# Patient Record
Sex: Female | Born: 1965 | Race: Black or African American | Hispanic: No | Marital: Married | Smoking: Current some day smoker
Health system: Southern US, Community
[De-identification: ages and names within clinical notes are randomized; demographics above are authoritative.]

## PROBLEM LIST (undated history)

## (undated) DIAGNOSIS — D259 Leiomyoma of uterus, unspecified: Secondary | ICD-10-CM

## (undated) DIAGNOSIS — I10 Essential (primary) hypertension: Secondary | ICD-10-CM

---

## 2021-11-22 ENCOUNTER — Emergency Department (HOSPITAL_COMMUNITY): Payer: BLUE CROSS/BLUE SHIELD

## 2021-11-22 ENCOUNTER — Encounter (HOSPITAL_COMMUNITY): Payer: Self-pay

## 2021-11-22 ENCOUNTER — Emergency Department (HOSPITAL_COMMUNITY)
Admission: EM | Admit: 2021-11-22 | Discharge: 2021-11-23 | Disposition: A | Discharge status: Home / Self Care | Payer: BLUE CROSS/BLUE SHIELD | Attending: Emergency Medicine | Admitting: Emergency Medicine

## 2021-11-22 ENCOUNTER — Other Ambulatory Visit: Payer: Self-pay

## 2021-11-22 DIAGNOSIS — Z20822 Contact with and (suspected) exposure to covid-19: Secondary | ICD-10-CM | POA: Diagnosis not present

## 2021-11-22 DIAGNOSIS — N3000 Acute cystitis without hematuria: Secondary | ICD-10-CM

## 2021-11-22 DIAGNOSIS — R509 Fever, unspecified: Secondary | ICD-10-CM | POA: Diagnosis present

## 2021-11-22 DIAGNOSIS — M791 Myalgia, unspecified site: Secondary | ICD-10-CM | POA: Insufficient documentation

## 2021-11-22 HISTORY — DX: Leiomyoma of uterus, unspecified: D25.9

## 2021-11-22 HISTORY — DX: Essential (primary) hypertension: I10

## 2021-11-22 LAB — CBC WITH DIFFERENTIAL/PLATELET
Abs Immature Granulocytes: 0.08 10*3/uL — ABNORMAL HIGH (ref 0.00–0.07)
Basophils Absolute: 0 10*3/uL (ref 0.0–0.1)
Basophils Relative: 0 %
Eosinophils Absolute: 0 10*3/uL (ref 0.0–0.5)
Eosinophils Relative: 0 %
HCT: 40.9 % (ref 36.0–46.0)
Hemoglobin: 14.1 g/dL (ref 12.0–15.0)
Immature Granulocytes: 1 %
Lymphocytes Relative: 5 %
Lymphs Abs: 0.5 10*3/uL — ABNORMAL LOW (ref 0.7–4.0)
MCH: 30.4 pg (ref 26.0–34.0)
MCHC: 34.5 g/dL (ref 30.0–36.0)
MCV: 88.1 fL (ref 80.0–100.0)
Monocytes Absolute: 1.1 10*3/uL — ABNORMAL HIGH (ref 0.1–1.0)
Monocytes Relative: 10 %
Neutro Abs: 9.2 10*3/uL — ABNORMAL HIGH (ref 1.7–7.7)
Neutrophils Relative %: 84 %
Platelets: 126 10*3/uL — ABNORMAL LOW (ref 150–400)
RBC: 4.64 MIL/uL (ref 3.87–5.11)
RDW: 14.3 % (ref 11.5–15.5)
WBC: 10.9 10*3/uL — ABNORMAL HIGH (ref 4.0–10.5)
nRBC: 0 % (ref 0.0–0.2)

## 2021-11-22 LAB — URINALYSIS, ROUTINE W REFLEX MICROSCOPIC
Bilirubin Urine: NEGATIVE
Glucose, UA: NEGATIVE mg/dL
Ketones, ur: NEGATIVE mg/dL
Nitrite: POSITIVE — AB
Protein, ur: 100 mg/dL — AB
Specific Gravity, Urine: 1.015 (ref 1.005–1.030)
WBC, UA: >50 WBC/hpf — ABNORMAL HIGH (ref 0–5)
pH: 5 (ref 5.0–8.0)

## 2021-11-22 LAB — COMPREHENSIVE METABOLIC PANEL
ALT: 40 U/L (ref 0–44)
AST: 57 U/L — ABNORMAL HIGH (ref 15–41)
Albumin: 3.4 g/dL — ABNORMAL LOW (ref 3.5–5.0)
Alkaline Phosphatase: 106 U/L (ref 38–126)
Anion gap: 14 (ref 5–15)
BUN: 17 mg/dL (ref 6–20)
CO2: 22 mmol/L (ref 22–32)
Calcium: 10 mg/dL (ref 8.9–10.3)
Chloride: 100 mmol/L (ref 98–111)
Creatinine, Ser: 1.09 mg/dL — ABNORMAL HIGH (ref 0.44–1.00)
GFR, Estimated: >60 mL/min (ref >60)
Glucose, Bld: 145 mg/dL — ABNORMAL HIGH (ref 70–99)
Potassium: 3.5 mmol/L (ref 3.5–5.1)
Sodium: 136 mmol/L (ref 135–145)
Total Bilirubin: 1 mg/dL (ref 0.3–1.2)
Total Protein: 7 g/dL (ref 6.5–8.1)

## 2021-11-22 MED ORDER — ACETAMINOPHEN 325 MG PO TABS
650.0000 mg | ORAL_TABLET | Freq: Once | ORAL | Status: AC | PRN
  Administered 2021-11-22: 650 mg via ORAL
  Filled 2021-11-22: qty 2

## 2021-11-22 NOTE — ED Notes (Signed)
Pt reports she last took tylenol over 6 hours ago ?

## 2021-11-22 NOTE — ED Triage Notes (Signed)
Pt reports body aches, sharp pains throughout her body and fever (tmax 104 today) and states she took tylenol tonight. Symptoms started two days ago. HR 142 in triage. Temp 102.9 ?

## 2021-11-22 NOTE — ED Provider Triage Note (Signed)
?  Emergency Medicine Provider Triage Evaluation Note ? ?MRN:  [id number]  ?Arrival date & time: 11/22/21    ?Medically screening exam initiated at 10:24 PM.   ?CC:   ?Fever ?  ?HPI:  ?Catherine Fitzpatrick is a 56 y.o. year-old female presents to the ED with chief complaint of fever and generalized body aches.  Denies sore throat, cough, n/v/d, dysuria.  Took Tylenol PTA. ? ?History provided by History provided by patient. ?ROS:  ?-As included in HPI ?PE:  ?There were no vitals filed for this visit.  ?Non-toxic appearing ?No respiratory distress ?No focal abdominal tenderness ?MDM:  ?Based on signs and symptoms, flu or covid is highest on my differential, followed by UTI or other infectious etiology. ?I've ordered covid swabs, CXR, and urine in triage to expedite lab/diagnostic workup. ? ?Patient was informed that the remainder of the evaluation will be completed by another provider, this initial triage assessment does not replace that evaluation, and the importance of remaining in the ED until their evaluation is complete. ? ?  ?Montine Circle, PA-C ?11/22/21 2225 ? ?

## 2021-11-23 LAB — RESP PANEL BY RT-PCR (FLU A&B, COVID) ARPGX2
Influenza A by PCR: NEGATIVE
Influenza B by PCR: NEGATIVE
SARS Coronavirus 2 by RT PCR: NEGATIVE

## 2021-11-23 MED ORDER — CEPHALEXIN 500 MG PO CAPS: 500.0000 mg | ORAL_CAPSULE | Freq: Two times a day (BID) | ORAL | 0 refills | Status: DC

## 2021-11-23 MED ORDER — CEPHALEXIN 250 MG PO CAPS
500.0000 mg | ORAL_CAPSULE | Freq: Once | ORAL | Status: AC
  Administered 2021-11-23: 500 mg via ORAL
  Filled 2021-11-23: qty 2

## 2021-11-23 NOTE — ED Provider Notes (Signed)
?[facility] DEPT ?[facility] Emergency Department ?Provider Note ?MRN:  [id number]  ?Arrival date & time: 11/23/21    ? ?Chief Complaint   ?Fever ?  ?History of Present Illness   ?Catherine Fitzpatrick is a 56 y.o. year-old female presents to the ED with chief complaint of  fever and generalized body aches.  Denies sore throat, cough, n/v/d, dysuria.  Took Tylenol PTA.   ? ?States that she noticed her urine was orange while waiting in the waiting room. ? ?History provided by patient. ? ? ?Review of Systems  ?Pertinent review of systems noted in HPI.  ? ? ?Physical Exam  ? ?Vitals:  ? 11/23/21 0047 11/23/21 0309  ?BP: 110/72 123/84  ?Pulse: (!) 103 92  ?Resp: 18 16  ?Temp: 99.6 ?F (37.6 ?C) 98.4 ?F (36.9 ?C)  ?SpO2: 99% 97%  ?  ?CONSTITUTIONAL:  well-appearing, NAD ?NEURO:  Alert and oriented x 3, CN 3-12 grossly intact ?EYES:  eyes equal and reactive ?ENT/NECK:  Supple, no stridor  ?CARDIO:  normal rate, regular rhythm, appears well-perfused  ?PULM:  No respiratory distress,  ?GI/GU:  non-distended, no focal abdominal tenderness ?MSK/SPINE:  No gross deformities, no edema, moves all extremities  ?SKIN:  no rash, atraumatic ? ? ?*Additional and/or pertinent findings included in MDM below ? ?Diagnostic and Interventional Summary  ? ? ?Labs Reviewed  ?URINALYSIS, ROUTINE W REFLEX MICROSCOPIC - Abnormal; Notable for the following components:  ?    Result Value  ? Color, Urine AMBER (*)   ? APPearance CLOUDY (*)   ? Hgb urine dipstick MODERATE (*)   ? Protein, ur 100 (*)   ? Nitrite POSITIVE (*)   ? Leukocytes,Ua LARGE (*)   ? WBC, UA >50 (*)   ? Bacteria, UA MANY (*)   ? Non Squamous Epithelial 6-10 (*)   ? All other components within normal limits  ?CBC WITH DIFFERENTIAL/PLATELET - Abnormal; Notable for the following components:  ? WBC 10.9 (*)   ? Platelets 126 (*)   ? Neutro Abs 9.2 (*)   ? Lymphs Abs 0.5 (*)   ? Monocytes Absolute 1.1 (*)   ? Abs Immature Granulocytes 0.08 (*)   ? All other components within  normal limits  ?COMPREHENSIVE METABOLIC PANEL - Abnormal; Notable for the following components:  ? Glucose, Bld 145 (*)   ? Creatinine, Ser 1.09 (*)   ? Albumin 3.4 (*)   ? AST 57 (*)   ? All other components within normal limits  ?RESP PANEL BY RT-PCR (FLU A&B, COVID) [id number]  ?  ?DG Chest 2 View  ?Final Result  ?  ?  ?Medications  ?cephALEXin (KEFLEX) capsule 500 mg (has no administration in time range)  ?acetaminophen (TYLENOL) tablet 650 mg (650 mg Oral Given 11/22/21 2245)  ?  ? ?Procedures  /  Critical Care ?Procedures ? ?ED Course and Medical Decision Making  ?I have reviewed the triage vital signs, the nursing notes, and pertinent available records from the EMR. ? ?Complexity of Problems Addressed: ?Moderate Complexity: Acute illness with systemic symptoms, requiring diagnostic workup to rule out more severe disease. ?Comorbidities affecting this illness/injury include: ?None ?Social Determinants Affecting Care: ?No clinically significant social determinants affecting this chief complaint.. ? ? ?ED Course: ?After considering the following differential, covid, flu, URI, UTI, I ordered labs, CXR, and UA. ?I personally interpreted the labs which are notable for abnormal UA ?I visualized the chest x-ray which is notable for no pneumonia and agree with the radiologist interpretation.. ? ?  ? ?  Consultants: ?No consultations were needed in caring for this patient. ? ?Treatment and Plan: ?UTI - Treat with keflex 500 mg BID ?Return precautions discussed ? ?Emergency department workup does not suggest an emergent condition requiring admission or immediate intervention beyond  what has been performed at this time. The patient is safe for discharge and has  been instructed to return immediately for worsening symptoms, change in  symptoms or any other concerns ? ? ? ?Final Clinical Impressions(s) / ED Diagnoses  ? ?  ICD-10-CM   ?1. Acute cystitis without hematuria  N30.00   ?  ?  ?ED Discharge Orders   ? ?      Ordered  ?   cephALEXin (KEFLEX) 500 MG capsule  2 times daily       ? 11/23/21 0335  ? ?  ?  ? ?  ?  ? ? ?Discharge Instructions Discussed with and Provided to Patient:  ? ?Discharge Instructions   ?None ?  ? ?  ?Montine Circle, PA-C ?11/23/21 0335 ? ?  ?Maudie Flakes, MD ?11/23/21 [phone] ? ?

## 2021-11-26 ENCOUNTER — Telehealth: Payer: BLUE CROSS/BLUE SHIELD | Admitting: Physician Assistant

## 2021-11-26 DIAGNOSIS — N3001 Acute cystitis with hematuria: Secondary | ICD-10-CM

## 2021-11-26 MED ORDER — SULFAMETHOXAZOLE-TRIMETHOPRIM 800-160 MG PO TABS: 1.0000 | ORAL_TABLET | Freq: Two times a day (BID) | ORAL | 0 refills | Status: DC

## 2021-11-26 NOTE — Patient Instructions (Signed)
?Tinamarie Derstine, thank you for joining Mar Daring, PA-C for today's virtual visit.  While this provider is not your primary care provider (PCP), if your PCP is located in our provider database this encounter information will be shared with them immediately following your visit. ? ?Consent: ?(Patient) Catherine Fitzpatrick provided verbal consent for this virtual visit at the beginning of the encounter. ? ?Current Medications: ? ?Current Outpatient Medications:  ?  sulfamethoxazole-trimethoprim (BACTRIM DS) 800-160 MG tablet, Take 1 tablet by mouth 2 (two) times daily., Disp: 10 tablet, Rfl: 0 ?  cephALEXin (KEFLEX) 500 MG capsule, Take 1 capsule (500 mg total) by mouth 2 (two) times daily., Disp: 14 capsule, Rfl: 0  ? ?Medications ordered in this encounter:  ?Meds ordered this encounter  ?Medications  ? sulfamethoxazole-trimethoprim (BACTRIM DS) 800-160 MG tablet  ?  Sig: Take 1 tablet by mouth 2 (two) times daily.  ?  Dispense:  10 tablet  ?  Refill:  0  ?  Order Specific Question:   Supervising Provider  ?  Answer:   Noemi Chapel [[id number]]  ?  ? ?*If you need refills on other medications prior to your next appointment, please contact your pharmacy* ? ?Follow-Up: ?Call back or seek an in-person evaluation if the symptoms worsen or if the condition fails to improve as anticipated. ? ?Other Instructions ?Urinary Tract Infection, Adult ?A urinary tract infection (UTI) is an infection of any part of the urinary tract. The urinary tract includes the kidneys, ureters, bladder, and urethra. These organs make, store, and get rid of urine in the body. ?An upper UTI affects the ureters and kidneys. A lower UTI affects the bladder and urethra. ?What are the causes? ?Most urinary tract infections are caused by bacteria in your genital area around your urethra, where urine leaves your body. These bacteria grow and cause inflammation of your urinary tract. ?What increases the risk? ?You are more likely to develop this condition  if: ?You have a urinary catheter that stays in place. ?You are not able to control when you urinate or have a bowel movement (incontinence). ?You are female and you: ?Use a spermicide or diaphragm for birth control. ?Have low estrogen levels. ?Are pregnant. ?You have certain genes that increase your risk. ?You are sexually active. ?You take antibiotic medicines. ?You have a condition that causes your flow of urine to slow down, such as: ?An enlarged prostate, if you are female. ?Blockage in your urethra. ?A kidney stone. ?A nerve condition that affects your bladder control (neurogenic bladder). ?Not getting enough to drink, or not urinating often. ?You have certain medical conditions, such as: ?Diabetes. ?A weak disease-fighting system (immunesystem). ?Sickle cell disease. ?Gout. ?Spinal cord injury. ?What are the signs or symptoms? ?Symptoms of this condition include: ?Needing to urinate right away (urgency). ?Frequent urination. This may include small amounts of urine each time you urinate. ?Pain or burning with urination. ?Blood in the urine. ?Urine that smells bad or unusual. ?Trouble urinating. ?Cloudy urine. ?Vaginal discharge, if you are female. ?Pain in the abdomen or the lower back. ?You may also have: ?Vomiting or a decreased appetite. ?Confusion. ?Irritability or tiredness. ?A fever or chills. ?Diarrhea. ?The first symptom in older adults may be confusion. In some cases, they may not have any symptoms until the infection has worsened. ?How is this diagnosed? ?This condition is diagnosed based on your medical history and a physical exam. You may also have other tests, including: ?Urine tests. ?Blood tests. ?Tests for STIs (sexually transmitted infections). ?  If you have had more than one UTI, a cystoscopy or imaging studies may be done to determine the cause of the infections. ?How is this treated? ?Treatment for this condition includes: ?Antibiotic medicine. ?Over-the-counter medicines to treat  discomfort. ?Drinking enough water to stay hydrated. ?If you have frequent infections or have other conditions such as a kidney stone, you may need to see a health care provider who specializes in the urinary tract (urologist). ?In rare cases, urinary tract infections can cause sepsis. Sepsis is a life-threatening condition that occurs when the body responds to an infection. Sepsis is treated in the hospital with IV antibiotics, fluids, and other medicines. ?Follow these instructions at home: ?Medicines ?Take over-the-counter and prescription medicines only as told by your health care provider. ?If you were prescribed an antibiotic medicine, take it as told by your health care provider. Do not stop using the antibiotic even if you start to feel better. ?General instructions ?Make sure you: ?Empty your bladder often and completely. Do not hold urine for long periods of time. ?Empty your bladder after sex. ?Wipe from front to back after urinating or having a bowel movement if you are female. Use each tissue only one time when you wipe. ?Drink enough fluid to keep your urine pale yellow. ?Keep all follow-up visits. This is important. ?Contact a health care provider if: ?Your symptoms do not get better after 1-2 days. ?Your symptoms go away and then return. ?Get help right away if: ?You have severe pain in your back or your lower abdomen. ?You have a fever or chills. ?You have nausea or vomiting. ?Summary ?A urinary tract infection (UTI) is an infection of any part of the urinary tract, which includes the kidneys, ureters, bladder, and urethra. ?Most urinary tract infections are caused by bacteria in your genital area. ?Treatment for this condition often includes antibiotic medicines. ?If you were prescribed an antibiotic medicine, take it as told by your health care provider. Do not stop using the antibiotic even if you start to feel better. ?Keep all follow-up visits. This is important. ?This information is not  intended to replace advice given to you by your health care provider. Make sure you discuss any questions you have with your health care provider. ?Document Revised: 04/06/2020 Document Reviewed: 04/06/2020 ?Elsevier Patient Education ? 2022 [place]. ? ? ? ?If you have been instructed to have an in-person evaluation today at a local Urgent Care facility, please use the link below. It will take you to a list of all of our available [facility], including address, phone number and hours of operation. Please do not delay care.  ?[facility] ? ?If you or a family member do not have a primary care provider, use the link below to schedule a visit and establish care. When you choose a [facility] primary care physician or advanced practice provider, you gain a long-term partner in health. ?Find a Primary Care Provider ? ?Learn more about [facility]'s in-office and virtual care options: ?[facility] ?

## 2021-11-26 NOTE — Progress Notes (Signed)
?Virtual Visit Consent  ? ?Catherine Fitzpatrick, you are scheduled for a virtual visit with a [facility] provider today.   ?  ?Just as with appointments in the office, your consent must be obtained to participate.  Your consent will be active for this visit and any virtual visit you may have with one of our providers in the next 365 days.   ?  ?If you have a MyChart account, a copy of this consent can be sent to you electronically.  All virtual visits are billed to your insurance company just like a traditional visit in the office.   ? ?As this is a virtual visit, video technology does not allow for your provider to perform a traditional examination.  This may limit your provider's ability to fully assess your condition.  If your provider identifies any concerns that need to be evaluated in person or the need to arrange testing (such as labs, EKG, etc.), we will make arrangements to do so.   ?  ?Although advances in technology are sophisticated, we cannot ensure that it will always work on either your end or our end.  If the connection with a video visit is poor, the visit may have to be switched to a telephone visit.  With either a video or telephone visit, we are not always able to ensure that we have a secure connection.    ? ?I need to obtain your verbal consent now.   Are you willing to proceed with your visit today?  ?  ?Delayla Ging has provided verbal consent on 11/26/2021 for a virtual visit (video or telephone). ?  ?Mar Daring, PA-C  ? ?Date: 11/26/2021 6:56 PM ? ? ?Virtual Visit via Video Note  ? Catherine Fitzpatrick, connected with  Catherine Fitzpatrick  ([id number], Apr 22, 1966) on 11/26/21 at  7:15 PM EDT by a video-enabled telemedicine application and verified that I am speaking with the correct person using two identifiers. ? ?Location: ?Patient: Virtual Visit Location Patient: Home ?Provider: Virtual Visit Location Provider: Home Office ?  ?I discussed the limitations of evaluation and management by  telemedicine and the availability of in person appointments. The patient expressed understanding and agreed to proceed.   ? ?History of Present Illness: ?Catherine Fitzpatrick is a 56 y.o. who identifies as a female who was assigned female at birth, and is being seen today for continued UTI symptoms. Was seen at the ER on 11/22/21 and found to have a UTI. Was started on Keflex '500mg'$  BID. Is still having fevers, cloudy urine, suprapubic pressure, and burning with urination. Denies chills, nausea, vomiting, flank pain. ? ? ? ?Problems: There are no problems to display for this patient. ?  ?Allergies:  ?Allergies  ?Allergen Reactions  ? Codeine Itching  ? ?Medications:  ?Current Outpatient Medications:  ?  sulfamethoxazole-trimethoprim (BACTRIM DS) 800-160 MG tablet, Take 1 tablet by mouth 2 (two) times daily., Disp: 10 tablet, Rfl: 0 ?  cephALEXin (KEFLEX) 500 MG capsule, Take 1 capsule (500 mg total) by mouth 2 (two) times daily., Disp: 14 capsule, Rfl: 0 ? ?Observations/Objective: ?Patient is well-developed, well-nourished in no acute distress.  ?Resting comfortably at home.  ?Head is normocephalic, atraumatic.  ?No labored breathing.  ?Speech is clear and coherent with logical content.  ?Patient is alert and oriented at baseline.  ? ? ?Assessment and Plan: ?1. Acute cystitis with hematuria ?- sulfamethoxazole-trimethoprim (BACTRIM DS) 800-160 MG tablet; Take 1 tablet by mouth 2 (two) times daily.  Dispense: 10 tablet; Refill:  0 ? ?- Suspect possible bacterial resistance, no urine culture ordered ?- Stop Keflex ?- Start Bactrim ?- Push fluids ?- Tylenol as needed ?- Seek in person evaluation if symptoms continue to worsen or not improve ? ?Follow Up Instructions: ?I discussed the assessment and treatment plan with the patient. The patient was provided an opportunity to ask questions and all were answered. The patient agreed with the plan and demonstrated an understanding of the instructions.  A copy of instructions were  sent to the patient via MyChart unless otherwise noted below.  ? ? ?The patient was advised to call back or seek an in-person evaluation if the symptoms worsen or if the condition fails to improve as anticipated. ? ?Time:  ?I spent 12 minutes with the patient via telehealth technology discussing the above problems/concerns.   ? ?Mar Daring, PA-C ?

## 2022-05-03 ENCOUNTER — Other Ambulatory Visit: Payer: Self-pay | Admitting: Pharmacist

## 2022-05-03 NOTE — Progress Notes (Addendum)
Community Screening Event  Patient seen at community screening event. Reports medication adherence to amlodipine 5 mg and HCTZ 50 mg daily, which is confirmed with fill history.   Dietary and lifestyle counseling provided by Dr. Joya Gaskins.   Patient also does report dehydration - likely related to HCTZ at 50 mg daily. Patient encouraged to check blood pressure at home and contact provider to schedule follow up. Dr Joya Gaskins increasing amlodipine 10 mg today. Consider reducing HCTZ moving forward given complaints of dehydration.  Patient interested in a provider here in [place]. Information given regarding [facility].   Catie Hedwig Morton, PharmD, [facility] [phone]

## 2022-05-21 ENCOUNTER — Ambulatory Visit (HOSPITAL_BASED_OUTPATIENT_CLINIC_OR_DEPARTMENT_OTHER): Payer: BLUE CROSS/BLUE SHIELD | Admitting: Critical Care Medicine

## 2022-05-21 DIAGNOSIS — Z91199 Patient's noncompliance with other medical treatment and regimen due to unspecified reason: Secondary | ICD-10-CM

## 2022-05-22 NOTE — Progress Notes (Signed)
Patient ID: Catherine Fitzpatrick, female   DOB: 1966-03-18, 56 y.o.   MRN: [id number] Pt was no show

## 2023-03-04 IMAGING — DX DG CHEST 2V
2 series · 2 of 2 positions shown · non-contrast
Comparison: None.

CLINICAL DATA: Cough, congestion and fever.

EXAM:
CHEST - 2 VIEW

[chest pa]
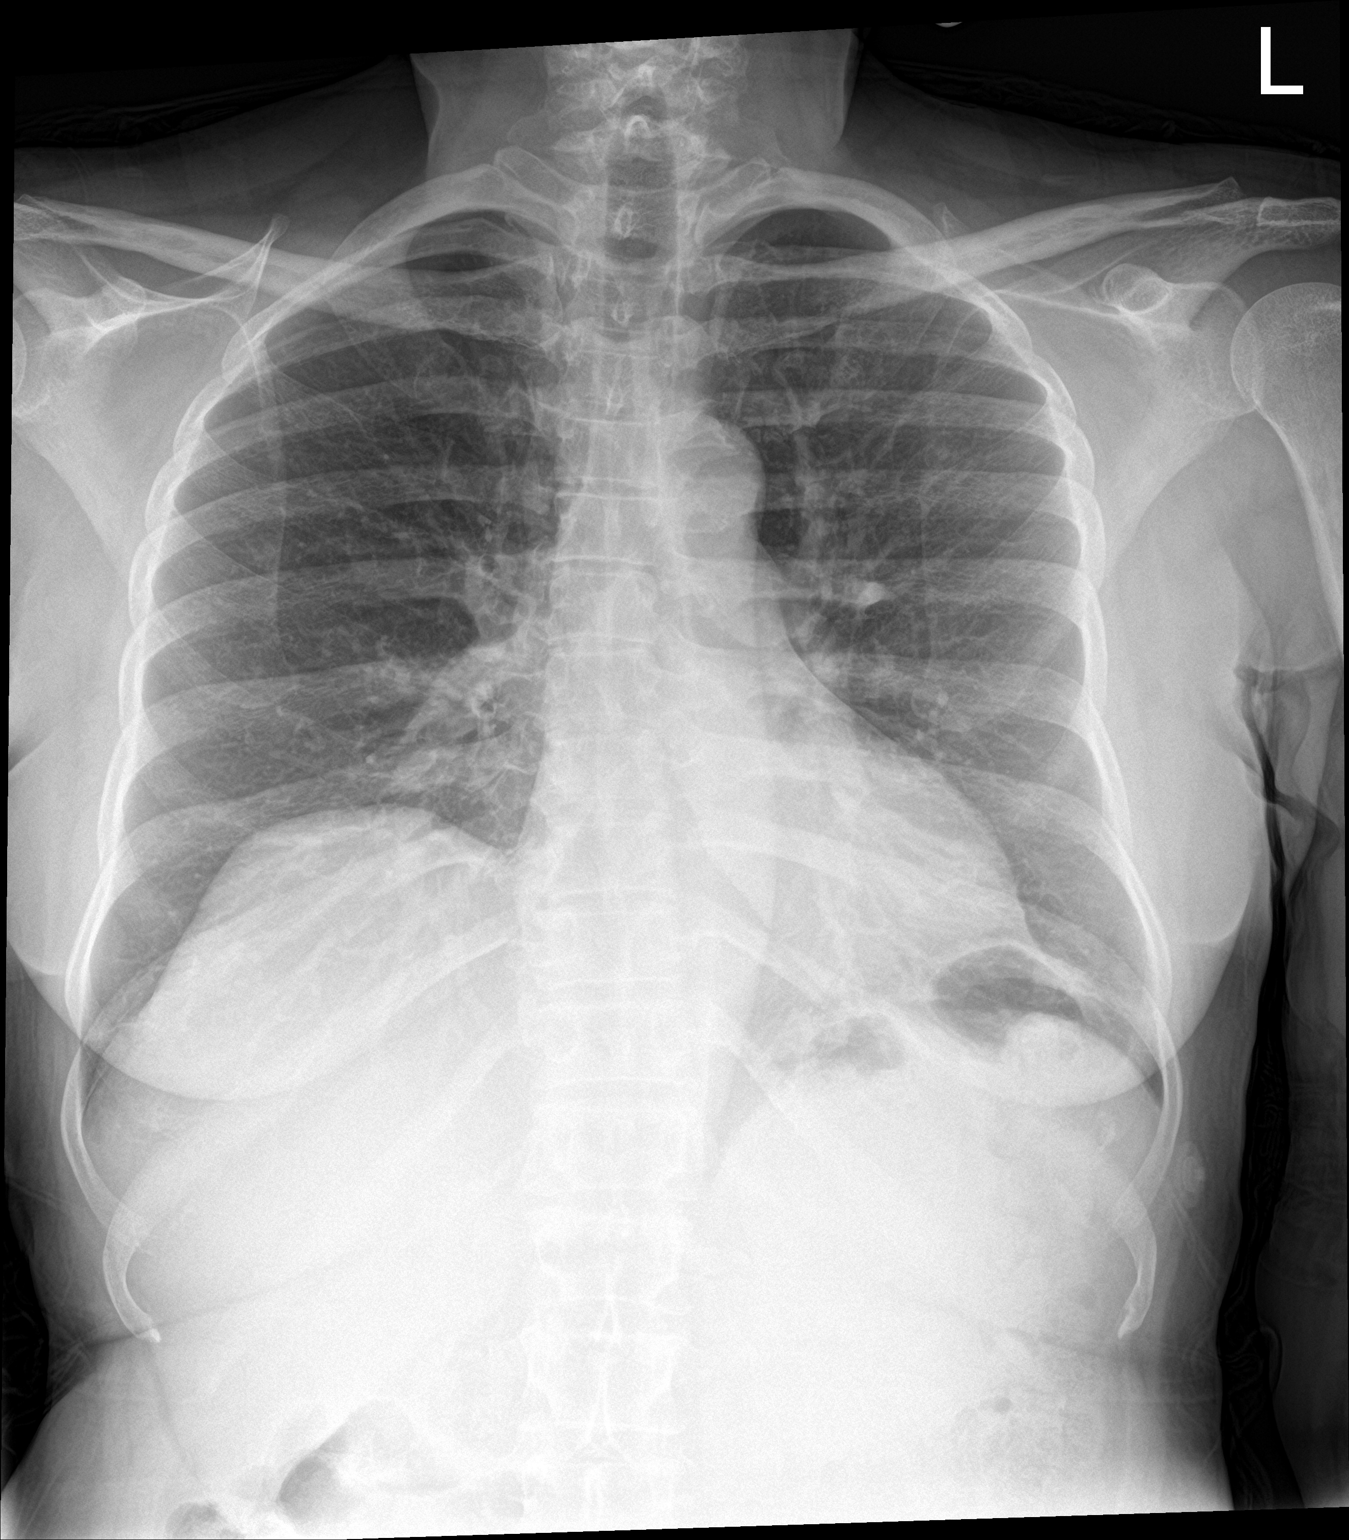

[chest lat]
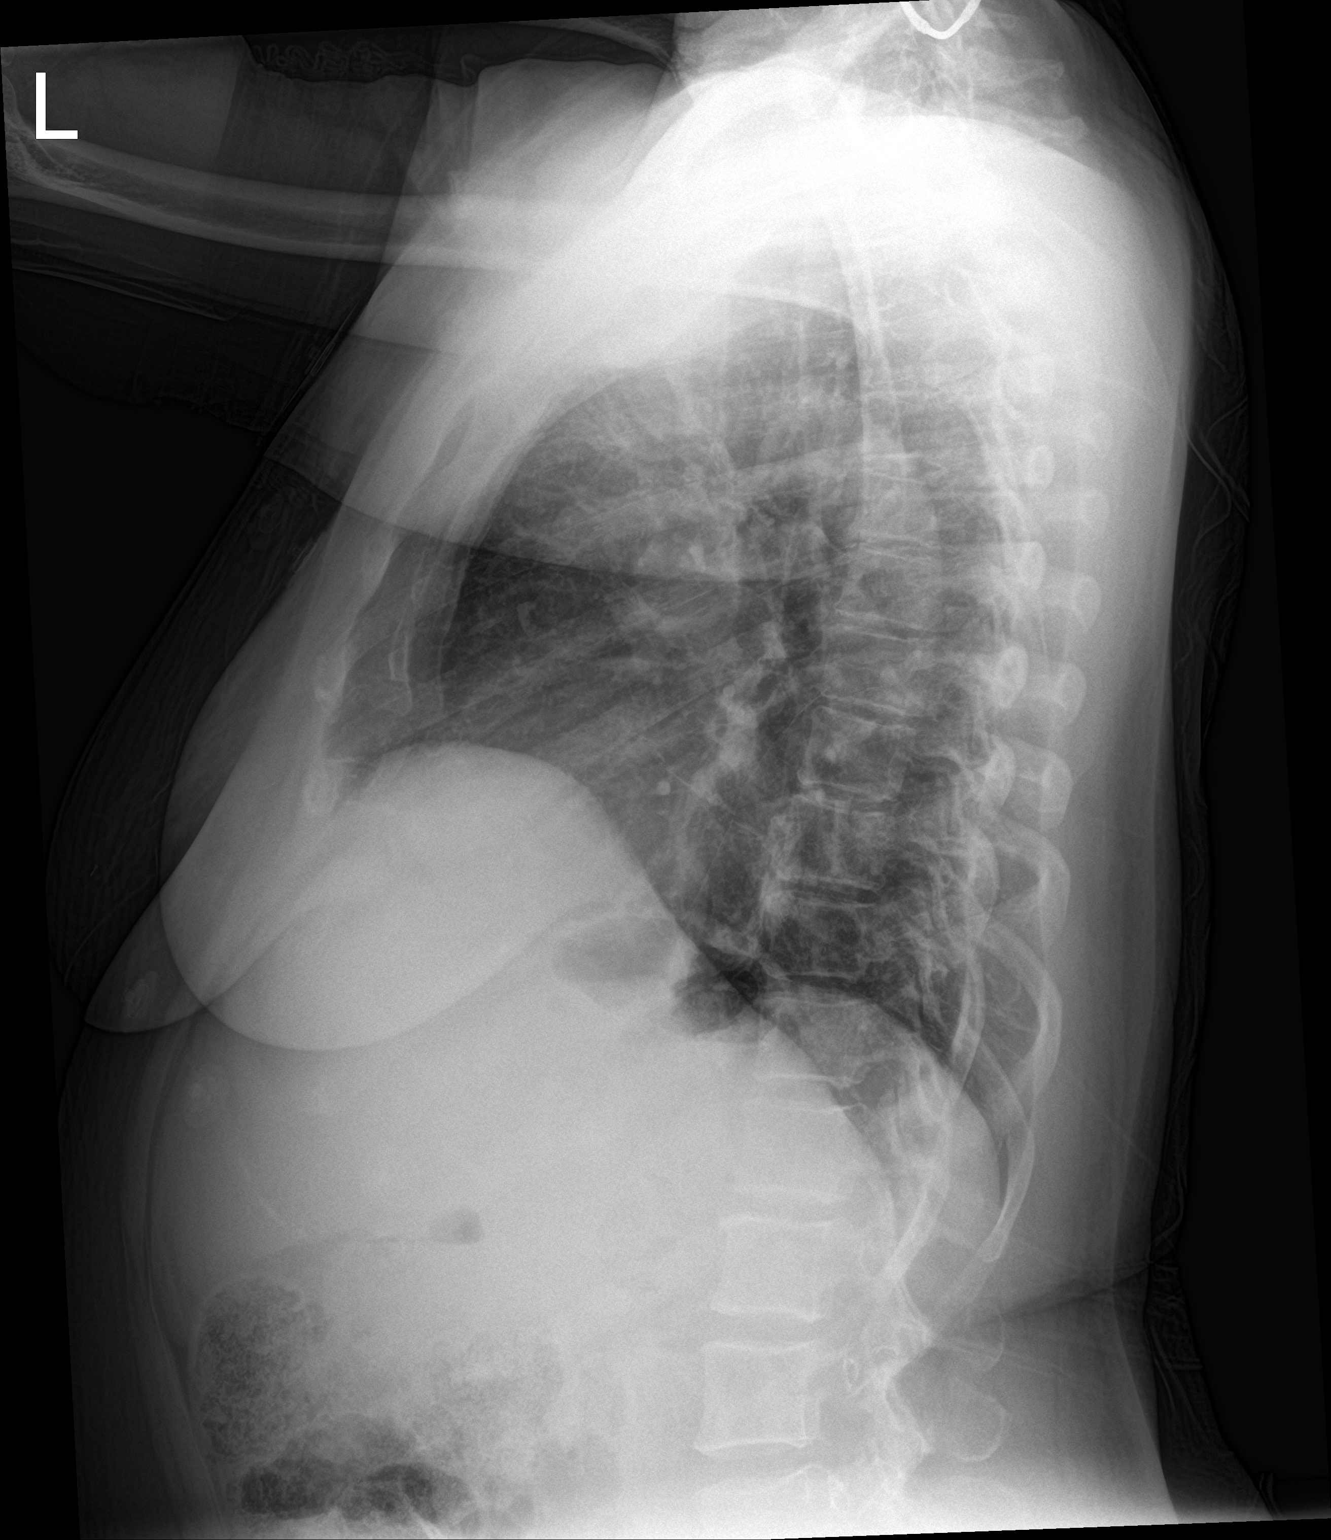

[2 of 2 positions shown; findings below may reference images not displayed]

FINDINGS: The heart size and mediastinal contours are within normal limits.
Both lungs are clear. The visualized skeletal structures are
unremarkable.
IMPRESSION: No active cardiopulmonary disease.
